# Patient Record
Sex: Female | Born: 1965 | Race: Black or African American | Hispanic: No | Marital: Married | Smoking: Never smoker
Health system: Southern US, Community
[De-identification: ages and names within clinical notes are randomized; demographics above are authoritative.]

## PROBLEM LIST (undated history)

## (undated) DIAGNOSIS — I82409 Acute embolism and thrombosis of unspecified deep veins of unspecified lower extremity: Secondary | ICD-10-CM

## (undated) HISTORY — PX: KNEE SURGERY: SHX244

---

## 2005-01-09 ENCOUNTER — Emergency Department: Payer: Self-pay | Admitting: Emergency Medicine

## 2005-02-27 ENCOUNTER — Emergency Department: Payer: Self-pay | Admitting: Emergency Medicine

## 2006-02-20 ENCOUNTER — Emergency Department: Payer: Self-pay | Admitting: Emergency Medicine

## 2006-04-08 ENCOUNTER — Emergency Department: Payer: Self-pay | Admitting: Emergency Medicine

## 2006-04-10 ENCOUNTER — Emergency Department: Payer: Self-pay | Admitting: Emergency Medicine

## 2006-10-20 ENCOUNTER — Emergency Department: Payer: Self-pay | Admitting: Emergency Medicine

## 2007-02-08 ENCOUNTER — Emergency Department: Payer: Self-pay | Admitting: Emergency Medicine

## 2007-02-08 ENCOUNTER — Other Ambulatory Visit: Payer: Self-pay

## 2007-08-23 ENCOUNTER — Ambulatory Visit: Payer: Self-pay | Admitting: Otolaryngology

## 2008-03-01 ENCOUNTER — Emergency Department: Payer: Self-pay | Admitting: Emergency Medicine

## 2009-06-29 ENCOUNTER — Emergency Department: Payer: Self-pay | Admitting: Emergency Medicine

## 2009-07-28 ENCOUNTER — Ambulatory Visit: Payer: Self-pay | Admitting: Family Medicine

## 2009-08-05 ENCOUNTER — Emergency Department: Payer: Self-pay | Admitting: Unknown Physician Specialty

## 2009-08-31 ENCOUNTER — Ambulatory Visit: Payer: Self-pay | Admitting: Obstetrics and Gynecology

## 2010-06-12 ENCOUNTER — Emergency Department: Payer: Self-pay | Admitting: Internal Medicine

## 2010-09-06 ENCOUNTER — Ambulatory Visit: Payer: Self-pay | Admitting: Family Medicine

## 2010-09-13 ENCOUNTER — Emergency Department: Payer: Self-pay

## 2010-10-29 ENCOUNTER — Emergency Department: Payer: Self-pay | Admitting: Emergency Medicine

## 2012-03-21 ENCOUNTER — Emergency Department: Payer: Self-pay | Admitting: Emergency Medicine

## 2012-06-13 ENCOUNTER — Emergency Department: Payer: Self-pay | Admitting: Emergency Medicine

## 2013-02-09 ENCOUNTER — Emergency Department: Payer: Self-pay | Admitting: Internal Medicine

## 2014-03-16 ENCOUNTER — Emergency Department: Payer: Self-pay | Admitting: Emergency Medicine

## 2014-05-17 ENCOUNTER — Emergency Department: Payer: Self-pay | Admitting: Emergency Medicine

## 2014-07-01 ENCOUNTER — Emergency Department: Payer: Self-pay | Admitting: Emergency Medicine

## 2014-07-01 LAB — CBC
HCT: 37.6 % (ref 35.0–47.0)
HGB: 12.9 g/dL (ref 12.0–16.0)
MCH: 38.9 pg — AB (ref 26.0–34.0)
MCHC: 34.2 g/dL (ref 32.0–36.0)
MCV: 114 fL — ABNORMAL HIGH (ref 80–100)
PLATELETS: 250 10*3/uL (ref 150–440)
RBC: 3.31 10*6/uL — AB (ref 3.80–5.20)
RDW: 15.6 % — ABNORMAL HIGH (ref 11.5–14.5)
WBC: 5.8 10*3/uL (ref 3.6–11.0)

## 2014-07-01 LAB — URINALYSIS, COMPLETE
BILIRUBIN, UR: NEGATIVE
Bacteria: NONE SEEN
Blood: NEGATIVE
Glucose,UR: NEGATIVE mg/dL (ref 0–75)
Ketone: NEGATIVE
Nitrite: NEGATIVE
PROTEIN: NEGATIVE
Ph: 6 (ref 4.5–8.0)
RBC,UR: 2 /HPF (ref 0–5)
Specific Gravity: 1.021 (ref 1.003–1.030)
Squamous Epithelial: 3
WBC UR: 8 /HPF (ref 0–5)

## 2014-07-01 LAB — PREGNANCY, URINE: Pregnancy Test, Urine: NEGATIVE m[IU]/mL

## 2014-07-01 LAB — COMPREHENSIVE METABOLIC PANEL
Albumin: 4 g/dL (ref 3.4–5.0)
Alkaline Phosphatase: 67 U/L
Anion Gap: 6 — ABNORMAL LOW (ref 7–16)
BUN: 19 mg/dL — ABNORMAL HIGH (ref 7–18)
Bilirubin,Total: 0.9 mg/dL (ref 0.2–1.0)
CALCIUM: 8.8 mg/dL (ref 8.5–10.1)
CHLORIDE: 106 mmol/L (ref 98–107)
CO2: 26 mmol/L (ref 21–32)
Creatinine: 0.85 mg/dL (ref 0.60–1.30)
EGFR (African American): >60
EGFR (Non-African Amer.): >60
GLUCOSE: 93 mg/dL (ref 65–99)
OSMOLALITY: 278 (ref 275–301)
POTASSIUM: 3.8 mmol/L (ref 3.5–5.1)
SGOT(AST): 15 U/L (ref 15–37)
SGPT (ALT): 22 U/L
Sodium: 138 mmol/L (ref 136–145)
Total Protein: 7.8 g/dL (ref 6.4–8.2)

## 2014-07-01 LAB — CK TOTAL AND CKMB (NOT AT ARMC)
CK, TOTAL: 251 U/L — AB
CK-MB: 2 ng/mL (ref 0.5–3.6)

## 2014-07-01 LAB — TROPONIN I

## 2014-11-21 ENCOUNTER — Emergency Department: Payer: Self-pay | Admitting: Physician Assistant

## 2015-01-07 ENCOUNTER — Emergency Department: Payer: Self-pay | Admitting: Internal Medicine

## 2015-02-13 ENCOUNTER — Emergency Department: Admit: 2015-02-13 | Disposition: A | Discharge status: Home / Self Care | Payer: Self-pay | Admitting: Student

## 2015-02-13 LAB — BASIC METABOLIC PANEL
ANION GAP: 6 — AB (ref 7–16)
BUN: 16 mg/dL
CHLORIDE: 110 mmol/L
Calcium, Total: 8.5 mg/dL — ABNORMAL LOW
Co2: 22 mmol/L
Creatinine: 0.58 mg/dL
EGFR (African American): >60
Glucose: 99 mg/dL
POTASSIUM: 3.5 mmol/L
Sodium: 138 mmol/L

## 2015-02-13 LAB — CBC
HCT: 32.8 % — ABNORMAL LOW (ref 35.0–47.0)
HGB: 11 g/dL — ABNORMAL LOW (ref 12.0–16.0)
MCH: 40.1 pg — ABNORMAL HIGH (ref 26.0–34.0)
MCHC: 33.4 g/dL (ref 32.0–36.0)
MCV: 120 fL — ABNORMAL HIGH (ref 80–100)
Platelet: 220 10*3/uL (ref 150–440)
RBC: 2.73 10*6/uL — ABNORMAL LOW (ref 3.80–5.20)
RDW: 17.3 % — ABNORMAL HIGH (ref 11.5–14.5)
WBC: 4.9 10*3/uL (ref 3.6–11.0)

## 2015-03-04 IMAGING — CR DG CHEST 2V
1 series · 2 of 2 positions shown · non-contrast
Comparison: 03/21/2012.

CLINICAL DATA: Chest pain

EXAM:
CHEST  2 VIEW

[Series 1: dxr chest pa (or ap) and lateral · 0.14mm/px · 2 of 2 slices shown]
[im 1/2]
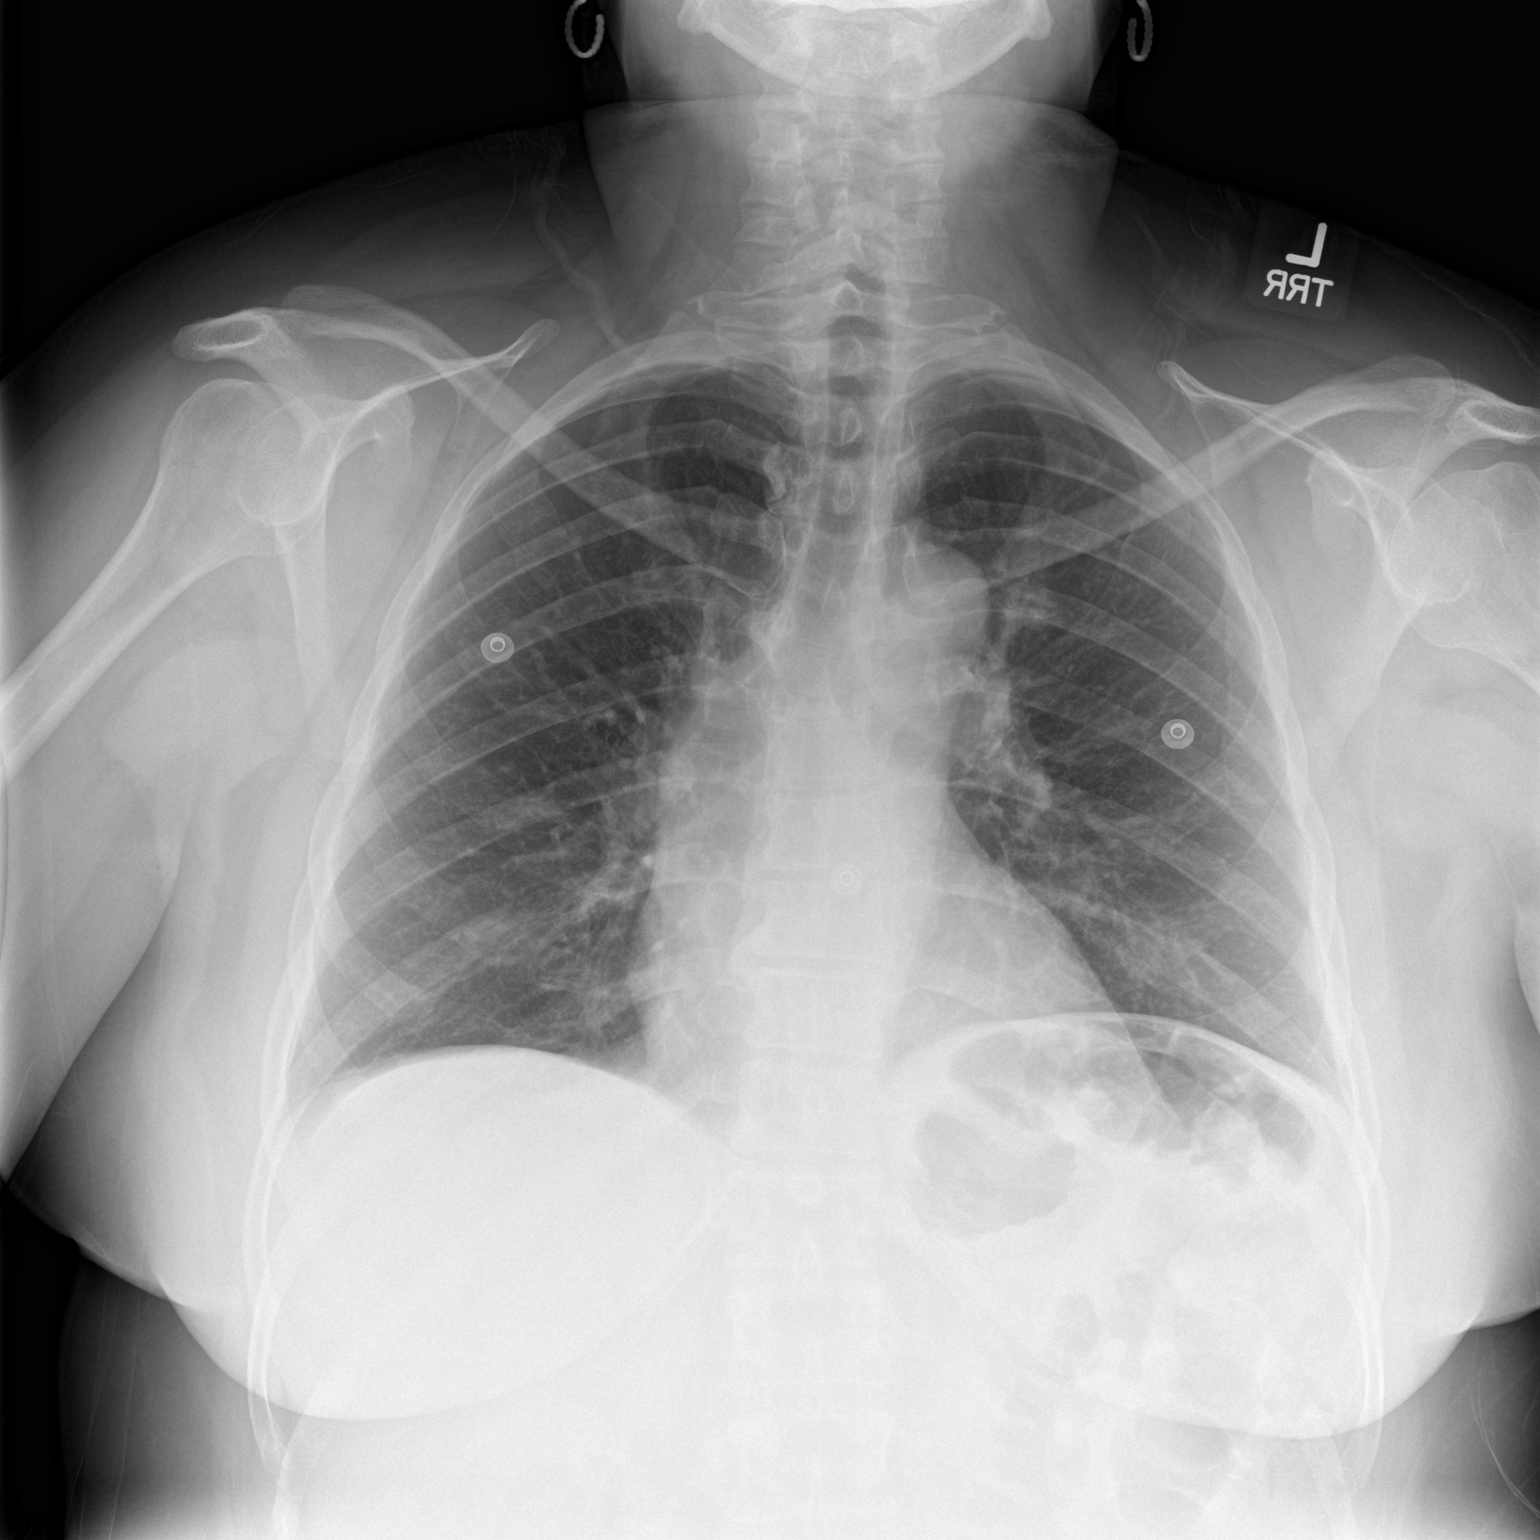
[im 2/2]
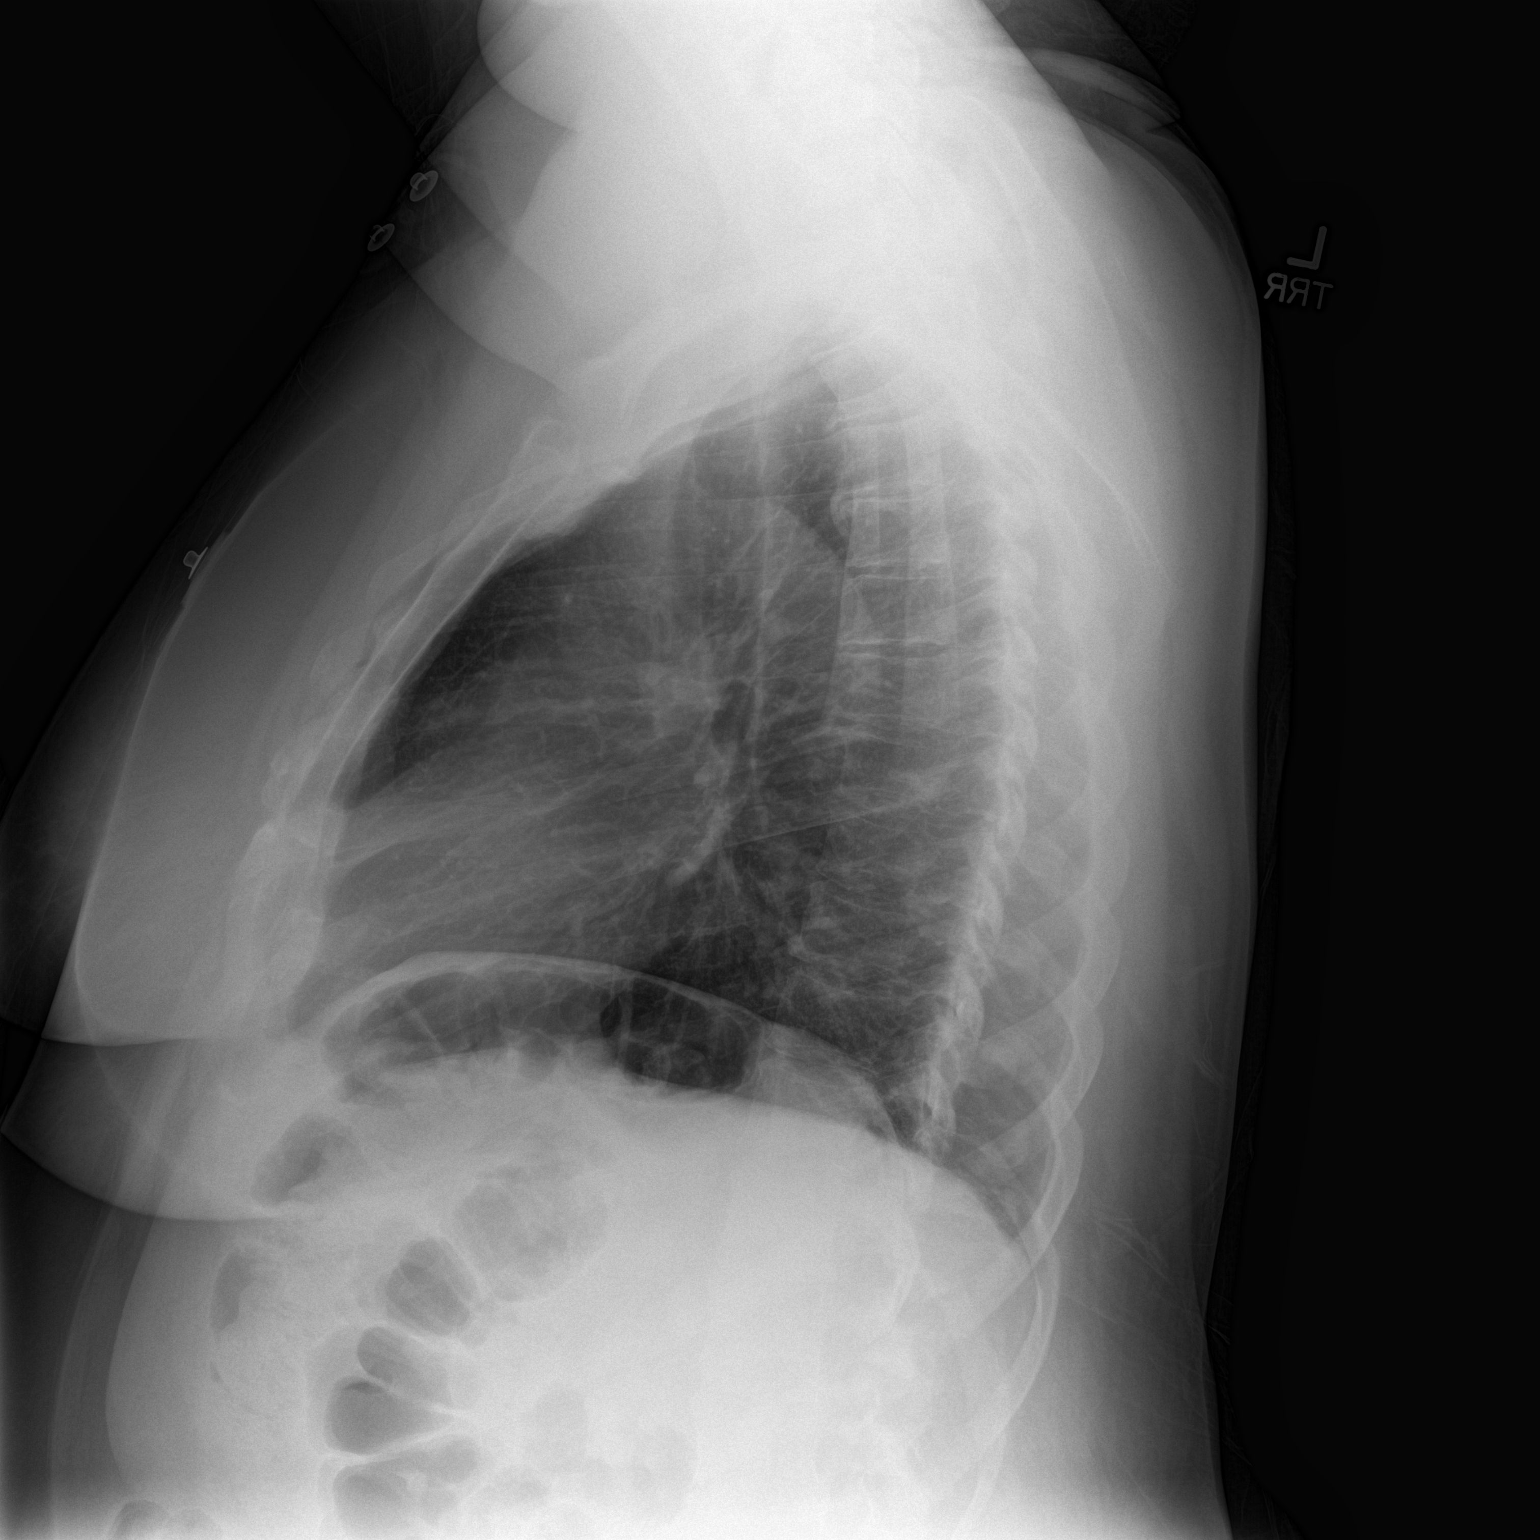

[2 of 2 positions shown; findings below may reference images not displayed]

FINDINGS: The lungs are clear without focal infiltrate, edema, pneumothorax or
pleural effusion. The cardiopericardial silhouette is within normal
limits for size. Imaged bony structures of the thorax are intact.
IMPRESSION: No acute cardiopulmonary findings.

## 2015-04-07 ENCOUNTER — Emergency Department
Admission: EM | Admit: 2015-04-07 | Discharge: 2015-04-07 | Disposition: A | Discharge status: Home / Self Care | Payer: BLUE CROSS/BLUE SHIELD | Attending: Emergency Medicine | Admitting: Emergency Medicine

## 2015-04-07 ENCOUNTER — Encounter: Payer: Self-pay | Admitting: General Practice

## 2015-04-07 ENCOUNTER — Emergency Department: Payer: BLUE CROSS/BLUE SHIELD

## 2015-04-07 DIAGNOSIS — Z7901 Long term (current) use of anticoagulants: Secondary | ICD-10-CM | POA: Diagnosis not present

## 2015-04-07 DIAGNOSIS — R609 Edema, unspecified: Secondary | ICD-10-CM

## 2015-04-07 DIAGNOSIS — R2242 Localized swelling, mass and lump, left lower limb: Secondary | ICD-10-CM | POA: Diagnosis present

## 2015-04-07 DIAGNOSIS — I824Z2 Acute embolism and thrombosis of unspecified deep veins of left distal lower extremity: Secondary | ICD-10-CM | POA: Insufficient documentation

## 2015-04-07 DIAGNOSIS — I82402 Acute embolism and thrombosis of unspecified deep veins of left lower extremity: Secondary | ICD-10-CM

## 2015-04-07 DIAGNOSIS — R52 Pain, unspecified: Secondary | ICD-10-CM

## 2015-04-07 LAB — CBC
HEMATOCRIT: 29.4 % — AB (ref 35.0–47.0)
Hemoglobin: 10 g/dL — ABNORMAL LOW (ref 12.0–16.0)
MCH: 42.8 pg — ABNORMAL HIGH (ref 26.0–34.0)
MCHC: 34.1 g/dL (ref 32.0–36.0)
MCV: 125.4 fL — ABNORMAL HIGH (ref 80.0–100.0)
Platelets: 214 10*3/uL (ref 150–440)
RBC: 2.35 MIL/uL — ABNORMAL LOW (ref 3.80–5.20)
RDW: 15.4 % — AB (ref 11.5–14.5)
WBC: 4.8 10*3/uL (ref 3.6–11.0)

## 2015-04-07 LAB — COMPREHENSIVE METABOLIC PANEL
ALT: 16 U/L (ref 14–54)
ANION GAP: 5 (ref 5–15)
AST: 18 U/L (ref 15–41)
Albumin: 4 g/dL (ref 3.5–5.0)
Alkaline Phosphatase: 47 U/L (ref 38–126)
BUN: 14 mg/dL (ref 6–20)
CHLORIDE: 110 mmol/L (ref 101–111)
CO2: 25 mmol/L (ref 22–32)
Calcium: 8.4 mg/dL — ABNORMAL LOW (ref 8.9–10.3)
Creatinine, Ser: 0.71 mg/dL (ref 0.44–1.00)
GFR calc Af Amer: >60 mL/min (ref >60)
GFR calc non Af Amer: >60 mL/min (ref >60)
Glucose, Bld: 92 mg/dL (ref 65–99)
Potassium: 4 mmol/L (ref 3.5–5.1)
Sodium: 140 mmol/L (ref 135–145)
Total Bilirubin: 0.9 mg/dL (ref 0.3–1.2)
Total Protein: 7.2 g/dL (ref 6.5–8.1)

## 2015-04-07 MED ORDER — RIVAROXABAN (XARELTO) VTE STARTER PACK (15 & 20 MG): ORAL_TABLET | ORAL | Status: AC

## 2015-04-07 MED ORDER — RIVAROXABAN 15 MG PO TABS
15.0000 mg | ORAL_TABLET | Freq: Once | ORAL | Status: AC
  Administered 2015-04-07: 15 mg via ORAL
  Filled 2015-04-07: qty 1

## 2015-04-07 NOTE — ED Notes (Signed)
Pt states she has had pain in her LLE x 2 weeks and it is getting worse. Ankle appears swollen and red, and it hot and tender to the touch. Pt alert & oriented with nad.

## 2015-04-07 NOTE — ED Provider Notes (Signed)
[facility] Emergency Department Provider Note  ____________________________________________  Time seen: 37  I have reviewed the triage vital signs and the nursing notes.   HISTORY  Chief Complaint Leg Swelling and Leg Pain     HPI Alejandra Andrews is a 49 y.o. female who reports she has been having discomfort and swelling in her left leg for approximately 2 weeks. She denies any accident. She has not had any travel or immobilization. She has no history of blood clots. She was seen approximate one week ago at [facility] in [place].  She had a negative x-ray at that time.  She presented to the emergency department now due to ongoing swelling and discomfort. She is having some erythema in the lower leg.  She denies any shortness of breath or chest pain.    History reviewed. No pertinent past medical history.  There are no active problems to display for this patient.   Past Surgical History  Procedure Laterality Date  . Knee surgery      Current Outpatient Rx  Name  Route  Sig  Dispense  Refill  . Rivaroxaban (XARELTO STARTER PACK) 15 & 20 MG TBPK      Take as directed on package: Start with one 15mg  tablet by mouth twice a day with food. On Day 22, switch to one 20mg  tablet once a day with food.   51 each   0     Allergies Review of patient's allergies indicates no known allergies.  No family history on file.  Social History History  Substance Use Topics  . Smoking status: Never Smoker   . Smokeless tobacco: Not on file  . Alcohol Use: Yes     Comment: causal    Review of Systems  Constitutional: Negative for fever. ENT: Negative for sore throat. Cardiovascular: Negative for chest pain. Respiratory: Negative for shortness of breath. Gastrointestinal: Negative for abdominal pain, vomiting and diarrhea. Genitourinary: Negative for dysuria. Musculoskeletal: Notable for swelling of the left lower leg. Skin: Erythema of the  lower leg on the left.. Neurological: Negative for headaches   10-point ROS otherwise negative.  ____________________________________________   PHYSICAL EXAM:  VITAL SIGNS: ED Triage Vitals  Enc Vitals Group     BP 04/07/15 1728 141/81 mmHg     Pulse Rate 04/07/15 1728 73     Resp 04/07/15 1728 18     Temp 04/07/15 1728 98.8 F (37.1 C)     Temp Source 04/07/15 1728 Oral     SpO2 04/07/15 1728 99 %     Weight 04/07/15 1728 216 lb (97.977 kg)     Height 04/07/15 1728 5\' 5"  (1.651 m)     Head Cir --      Peak Flow --      Pain Score 04/07/15 1728 9     Pain Loc --      Pain Edu? --      Excl. in GC? --     Constitutional:  Alert and oriented. Well appearing and in no distress. ENT   Head: Normocephalic and atraumatic.   Nose: No congestion/rhinnorhea.   Mouth/Throat: Mucous membranes are moist. Cardiovascular: Normal rate, regular rhythm. Respiratory: Normal respiratory effort without tachypnea. Breath sounds are clear and equal bilaterally. No wheezes/rales/rhonchi. Gastrointestinal: Soft and nontender. No distention.  Back: No muscle spasm, no tenderness, no CVA tenderness. Musculoskeletal: Nontender with normal range of motion in all extremities.  Notable edema in the left lower leg. Neurologic:  Normal speech and  language. No gross focal neurologic deficits are appreciated.  Skin:  Skin is warm, dry. The left lower leg is mildly erythematous.  Psychiatric: Mood and affect are normal. Speech and behavior are normal.  ____________________________________________    LABS (pertinent positives/negatives)  CBC: White blood cell 4.8, hemoglobin of 10, platelets of 214 Metabolic panel within normal limits.  ____________________________________________   RADIOLOGY  Doppler ultrasound of the left lower leg IMPRESSION: Occlusive thrombus noted filling the left popliteal vein. The calf veins are not visualized on this  study.  ____________________________________________ ____________________________________________   INITIAL IMPRESSION / ASSESSMENT AND PLAN / ED COURSE  Patient with signs and symptoms of a DVT now confirmed by ultrasound. The patient does not have a history of prior DVT. She has no health problems and takes no medications. We will start her on an anticoagulant. She has the ability to obtain this anticoagulant. We'll give her the first dose tonight in the emergency department. Her primary physician is Dr. Harl Bowie. A vascular follow-up with him as well as with vascular surgery.  ____________________________________________   FINAL CLINICAL IMPRESSION(S) / ED DIAGNOSES  Final diagnoses:  DVT of leg (deep venous thrombosis), left      Darien Ramus, MD 04/07/15 2100

## 2015-04-07 NOTE — Discharge Instructions (Signed)
Take Xarelto as prescribed. Follow-up with vascular surgery, Dr. Wyn Quaker or Dr. Lorretta Harp. Follow up with your regular doctor.  Return to the Em Dept if you have any chest pain or shortness of breath or if you have any other urgent concerns.

## 2015-04-07 NOTE — ED Notes (Signed)
Pt. Arrived to ed from home with reports of painful and swelling to LLE. Pt reports swelling and pain started about two weeks ago. Pt reports being seen last week by anther ED and was told "nothing was wrong with it" per pt. Pt reports no improvement with pain or swelling. Pt ambulatory Alert and oriented.

## 2015-04-16 ENCOUNTER — Ambulatory Visit: Admit: 2015-04-16 | Payer: Self-pay | Admitting: Vascular Surgery

## 2015-04-16 ENCOUNTER — Ambulatory Visit
Admission: RE | Admit: 2015-04-16 | Discharge: 2015-04-16 | Disposition: A | Discharge status: Home / Self Care | Payer: BLUE CROSS/BLUE SHIELD | Source: Ambulatory Visit | Attending: Vascular Surgery | Admitting: Vascular Surgery

## 2015-04-16 ENCOUNTER — Encounter
Admission: RE | Disposition: A | Discharge status: Home / Self Care | Payer: Self-pay | Source: Ambulatory Visit | Attending: Vascular Surgery

## 2015-04-16 SURGERY — IVC FILTER INSERTION
Anesthesia: Moderate Sedation | Laterality: Left

## 2015-04-20 ENCOUNTER — Encounter
Admission: RE | Disposition: A | Discharge status: Home / Self Care | Payer: Self-pay | Source: Ambulatory Visit | Attending: Vascular Surgery

## 2015-04-20 ENCOUNTER — Encounter: Payer: Self-pay | Admitting: *Deleted

## 2015-04-20 ENCOUNTER — Ambulatory Visit: Admit: 2015-04-20 | Payer: Self-pay | Admitting: Vascular Surgery

## 2015-04-20 ENCOUNTER — Ambulatory Visit
Admission: RE | Admit: 2015-04-20 | Discharge: 2015-04-20 | Disposition: A | Discharge status: Home / Self Care | Payer: BLUE CROSS/BLUE SHIELD | Source: Ambulatory Visit | Attending: Vascular Surgery | Admitting: Vascular Surgery

## 2015-04-20 DIAGNOSIS — I82432 Acute embolism and thrombosis of left popliteal vein: Secondary | ICD-10-CM | POA: Diagnosis present

## 2015-04-20 DIAGNOSIS — Z79899 Other long term (current) drug therapy: Secondary | ICD-10-CM | POA: Insufficient documentation

## 2015-04-20 DIAGNOSIS — Z6839 Body mass index (BMI) 39.0-39.9, adult: Secondary | ICD-10-CM | POA: Insufficient documentation

## 2015-04-20 HISTORY — PX: PERIPHERAL VASCULAR CATHETERIZATION: SHX172C

## 2015-04-20 HISTORY — DX: Acute embolism and thrombosis of unspecified deep veins of unspecified lower extremity: I82.409

## 2015-04-20 LAB — BASIC METABOLIC PANEL
Anion gap: 6 (ref 5–15)
BUN: 14 mg/dL (ref 6–20)
CHLORIDE: 108 mmol/L (ref 101–111)
CO2: 26 mmol/L (ref 22–32)
CREATININE: 0.57 mg/dL (ref 0.44–1.00)
Calcium: 8.5 mg/dL — ABNORMAL LOW (ref 8.9–10.3)
GFR calc Af Amer: >60 mL/min (ref >60)
GFR calc non Af Amer: >60 mL/min (ref >60)
Glucose, Bld: 87 mg/dL (ref 65–99)
Potassium: 4 mmol/L (ref 3.5–5.1)
Sodium: 140 mmol/L (ref 135–145)

## 2015-04-20 SURGERY — THROMBECTOMY
Anesthesia: Moderate Sedation

## 2015-04-20 SURGERY — THROMBECTOMY
Anesthesia: Moderate Sedation | Laterality: Left

## 2015-04-20 SURGERY — IVC FILTER INSERTION
Anesthesia: Moderate Sedation

## 2015-04-20 MED ORDER — FENTANYL CITRATE (PF) 100 MCG/2ML IJ SOLN
INTRAMUSCULAR | Status: AC
Start: 2015-04-20 — End: 2015-04-20
  Filled 2015-04-20: qty 2

## 2015-04-20 MED ORDER — RIVAROXABAN 20 MG PO TABS
20.0000 mg | ORAL_TABLET | Freq: Every day | ORAL | Status: DC
  Filled 2015-04-20 (×3): qty 1

## 2015-04-20 MED ORDER — LIDOCAINE HCL (PF) 1 % IJ SOLN
INTRAMUSCULAR | Status: AC
  Filled 2015-04-20: qty 10

## 2015-04-20 MED ORDER — HEPARIN SODIUM (PORCINE) 1000 UNIT/ML IJ SOLN
INTRAMUSCULAR | Status: AC
  Filled 2015-04-20: qty 1

## 2015-04-20 MED ORDER — SODIUM CHLORIDE 0.9 % IV SOLN
INTRAVENOUS | Status: DC
  Administered 2015-04-20: 11:00:00 via INTRAVENOUS

## 2015-04-20 MED ORDER — FENTANYL CITRATE (PF) 100 MCG/2ML IJ SOLN
INTRAMUSCULAR | Status: AC
  Filled 2015-04-20: qty 2

## 2015-04-20 MED ORDER — CEFAZOLIN SODIUM 1-5 GM-% IV SOLN
INTRAVENOUS | Status: AC
  Filled 2015-04-20: qty 50

## 2015-04-20 MED ORDER — IOHEXOL 300 MG/ML  SOLN
INTRAMUSCULAR | Status: DC | PRN
  Administered 2015-04-20: 35 mL via INTRAVENOUS

## 2015-04-20 MED ORDER — MIDAZOLAM HCL 2 MG/2ML IJ SOLN
INTRAMUSCULAR | Status: DC | PRN
  Administered 2015-04-20: 1 mg via INTRAVENOUS
  Administered 2015-04-20: 2 mg via INTRAVENOUS

## 2015-04-20 MED ORDER — FENTANYL CITRATE (PF) 100 MCG/2ML IJ SOLN
INTRAMUSCULAR | Status: DC | PRN
  Administered 2015-04-20: 50 ug via INTRAVENOUS

## 2015-04-20 MED ORDER — STERILE WATER FOR INJECTION IJ SOLN
INTRAMUSCULAR | Status: AC
  Filled 2015-04-20: qty 40

## 2015-04-20 MED ORDER — CEFAZOLIN SODIUM 1-5 GM-% IV SOLN
1.0000 g | Freq: Once | INTRAVENOUS | Status: AC
  Administered 2015-04-20: 1 g via INTRAVENOUS

## 2015-04-20 MED ORDER — LIDOCAINE HCL (PF) 1 % IJ SOLN
INTRAMUSCULAR | Status: AC
Start: 2015-04-20 — End: 2015-04-20
  Filled 2015-04-20: qty 5

## 2015-04-20 MED ORDER — MIDAZOLAM HCL 5 MG/5ML IJ SOLN
INTRAMUSCULAR | Status: AC
  Filled 2015-04-20: qty 5

## 2015-04-20 SURGICAL SUPPLY — 14 items
CATH SLIP KMP 65CM 5FR (CATHETERS) ×4 IMPLANT
DEVICE PRESTO INFLATION (MISCELLANEOUS) ×4 IMPLANT
DEVICE SAFEGUARD 24CM (GAUZE/BANDAGES/DRESSINGS) ×4 IMPLANT
DRAPE BRACHIAL (DRAPES) ×4 IMPLANT
DRAPE TABLE BACK 80X90 (DRAPES) ×4 IMPLANT
FILTER VC CELECT-FEMORAL (Filter) ×4 IMPLANT
PACK ANGIOGRAPHY (CUSTOM PROCEDURE TRAY) ×4 IMPLANT
PREP CHG 10.5 TEAL (MISCELLANEOUS) ×4 IMPLANT
SET INTRO CAPELLA COAXIAL (SET/KITS/TRAYS/PACK) ×4 IMPLANT
SET ZELANTE DVT THROMB (CATHETERS) ×4 IMPLANT
SHEATH BRITE TIP 8FRX11 (SHEATH) ×4 IMPLANT
TOWEL OR 17X26 4PK STRL BLUE (TOWEL DISPOSABLE) ×4 IMPLANT
WIRE J 3MM .035X145CM (WIRE) ×4 IMPLANT
WIRE MAGIC TORQUE 260C (WIRE) ×4 IMPLANT

## 2015-04-20 NOTE — Discharge Instructions (Signed)
Embolectomy and Thrombectomy, Care After Refer to this sheet in the next few weeks. These discharge instructions provide you with general information on caring for yourself after you leave the hospital. Your health care provider may also give you specific instructions. Your treatment has been planned according to the most current medical practices available, but unavoidable complications sometimes occur. If you have any problems or questions after discharge, call your health care provider. HOME CARE INSTRUCTIONS  You will probably be put on blood thinners. Take them as directed.  Tell your health care provider about any unusual bruising or bleeding, including nosebleeds, blood in your urine, blood in your stools, or if you are vomiting blood. Blood in the stools may be black and tarry or red.  Keep your follow-up appointments to check how thin your blood is.  Change your bandages (dressings) as instructed.  Do not swim, bathe, or shower except as allowed by your health care provider.  Do not smoke.  Do not drink alcohol.  Do not use any tobacco products including cigarettes, chewing tobacco, or electronic cigarettes. SEEK IMMEDIATE MEDICAL CARE IF:  You have bleeding from the surgical site.  You have a sudden pain in the foot, leg, hand, or arm.  You have sudden abdominal pain.  You have a sudden facial droop, weakness on one side of your body, or trouble speaking.  You have bloody stools.  You vomit blood.  You suddenly feel short of breath, weak, or dizzy.  You have chest pain.  You have drainage, redness, swelling, or pain at the surgery site.  You have a fever. MAKE SURE YOU:  Understand these instructions.  Will watch your condition.  Will get help right away if you are not doing well or get worse. Document Released: 02/03/2011 Document Revised: 02/23/2014 Document Reviewed: 02/03/2011 [facility] Patient Information 2015 [place], [place]. This information is not  intended to replace advice given to you by your health care provider. Make sure you discuss any questions you have with your health care provider. Inferior Vena Cava Filter Insertion, Care After Refer to this sheet in the next few weeks. These instructions provide you with information on caring for yourself after your procedure. Your health care provider may also give you more specific instructions. Your treatment has been planned according to current medical practices, but problems sometimes occur. Call your health care provider if you have any problems or questions after your procedure. WHAT TO EXPECT AFTER THE PROCEDURE After your procedure, it is typical to have the following:  Mild pain in the area where the filter was inserted.  Mild bruising in the area where the filter was inserted. HOME CARE INSTRUCTIONS  You will be given medicine to control pain. Only take over-the-counter or prescription medicines for pain, fever, or discomfort as directed by your health care provider.  A bandage (dressing) has been placed over the insertion site. Follow your health care provider's instructions on how to care for it.  Keep the insertion site clean and dry.  Do not soak in a bath tub or pool until the filter insertion site has healed.  Do not drive if you are taking narcotic pain medicines. Follow your health care provider's instructions about driving.  Do not return to work or school until your health care provider says it is okay.   Keep all follow-up appointments.  SEEK IMMEDIATE MEDICAL CARE IF:  You develop swelling and discoloration or pain in the legs.  Your legs become pale and cold or blue.  You develop shortness of breath, feel faint, or pass out.  You develop chest pain, a cough, or difficulty breathing.  You cough up blood.  You develop a rash or feel you are having problems that may be a side effect of medicines.  You develop weakness, difficulty moving your arms or  legs, or balance problems.  You develop problems with speech or vision. Document Released: 07/30/2013 Document Reviewed: 07/30/2013 [facility] Patient Information 2015 [place], [place]. This information is not intended to replace advice given to you by your health care provider. Make sure you discuss any questions you have with your health care provider.

## 2015-04-21 ENCOUNTER — Encounter: Payer: Self-pay | Admitting: Vascular Surgery

## 2015-05-15 ENCOUNTER — Emergency Department
Admission: EM | Admit: 2015-05-15 | Discharge: 2015-05-15 | Disposition: A | Discharge status: Home / Self Care | Payer: BLUE CROSS/BLUE SHIELD | Attending: Emergency Medicine | Admitting: Emergency Medicine

## 2015-05-15 DIAGNOSIS — I82402 Acute embolism and thrombosis of unspecified deep veins of left lower extremity: Secondary | ICD-10-CM | POA: Insufficient documentation

## 2015-05-15 DIAGNOSIS — Z7901 Long term (current) use of anticoagulants: Secondary | ICD-10-CM | POA: Insufficient documentation

## 2015-05-15 NOTE — ED Notes (Signed)
Pt reports having vena cava filter placed to left leg on 04/20/15 by Dr. Gilda Crease. Reports pain, swelling and numbness at times to left leg. Also c/o numbness to bilateral hands. Reports unable to wait for MD appt to be evaluated.

## 2015-05-15 NOTE — Discharge Instructions (Signed)
Deep Vein Thrombosis  Please continue Xarelto. Keep your left leg elevated and minimize time on your feet to improve swelling. Return to the emergency room if you develop trouble breathing, feel short of breath, have a cough or chest pain, numbness or tingling in the foot, a cold or blue foot, or new concerns arise.  Follow-up with vascular surgery on Monday with Dr. Gilda Crease.  A deep vein thrombosis (DVT) is a blood clot that develops in the deep, larger veins of the leg, arm, or pelvis. These are more dangerous than clots that might form in veins near the surface of the body. A DVT can lead to serious and even life-threatening complications if the clot breaks off and travels in the bloodstream to the lungs.  A DVT can damage the valves in your leg veins so that instead of flowing upward, the blood pools in the lower leg. This is called post-thrombotic syndrome, and it can result in pain, swelling, discoloration, and sores on the leg. CAUSES Usually, several things contribute to the formation of blood clots. Contributing factors include:  The flow of blood slows down.  The inside of the vein is damaged in some way.  You have a condition that makes blood clot more easily. RISK FACTORS Some people are more likely than others to develop blood clots. Risk factors include:   Smoking.  Being overweight (obese).  Sitting or lying still for a long time. This includes long-distance travel, paralysis, or recovery from an illness or surgery. Other factors that increase risk are:   Older age, especially over 68 years of age.  Having a family history of blood clots or if you have already had a blot clot.  Having major or lengthy surgery. This is especially true for surgery on the hip, knee, or belly (abdomen). Hip surgery is particularly high risk.  Having a long, thin tube (catheter) placed inside a vein during a medical procedure.  Breaking a hip or leg.  Having cancer or cancer  treatment.  Pregnancy and childbirth.  Hormone changes make the blood clot more easily during pregnancy.  The fetus puts pressure on the veins of the pelvis.  There is a risk of injury to veins during delivery or a caesarean delivery. The risk is highest just after childbirth.  Medicines containing the female hormone estrogen. This includes birth control pills and hormone replacement therapy.  Other circulation or heart problems.  SIGNS AND SYMPTOMS When a clot forms, it can either partially or totally block the blood flow in that vein. Symptoms of a DVT can include:  Swelling of the leg or arm, especially if one side is much worse.  Warmth and redness of the leg or arm, especially if one side is much worse.  Pain in an arm or leg. If the clot is in the leg, symptoms may be more noticeable or worse when standing or walking. The symptoms of a DVT that has traveled to the lungs (pulmonary embolism, PE) usually start suddenly and include:  Shortness of breath.  Coughing.  Coughing up blood or blood-tinged mucus.  Chest pain. The chest pain is often worse with deep breaths.  Rapid heartbeat. Anyone with these symptoms should get emergency medical treatment right away. Do not wait to see if the symptoms will go away. Call your local emergency services (911 in the [place] if you have these symptoms. Do not drive yourself to the hospital. DIAGNOSIS If a DVT is suspected, your health care provider will take a full  medical history and perform a physical exam. Tests that also may be required include:  Blood tests, including studies of the clotting properties of the blood.  Ultrasound to see if you have clots in your legs or lungs.  X-rays to show the flow of blood when dye is injected into the veins (venogram).  Studies of your lungs if you have any chest symptoms. PREVENTION  Exercise the legs regularly. Take a brisk 30-minute walk every day.  Maintain a weight that is  appropriate for your height.  Avoid sitting or lying in bed for long periods of time without moving your legs.  Women, particularly those over the age of 35 years, should consider the risks and benefits of taking estrogen medicines, including birth control pills.  Do not smoke, especially if you take estrogen medicines.  Long-distance travel can increase your risk of DVT. You should exercise your legs by walking or pumping the muscles every hour.  Many of the risk factors above relate to situations that exist with hospitalization, either for illness, injury, or elective surgery. Prevention may include medical and nonmedical measures.  Your health care provider will assess you for the need for venous thromboembolism prevention when you are admitted to the hospital. If you are having surgery, your surgeon will assess you the day of or day after surgery. TREATMENT Once identified, a DVT can be treated. It can also be prevented in some circumstances. Once you have had a DVT, you may be at increased risk for a DVT in the future. The most common treatment for DVT is blood-thinning (anticoagulant) medicine, which reduces the blood's tendency to clot. Anticoagulants can stop new blood clots from forming and stop old clots from growing. They cannot dissolve existing clots. Your body does this by itself over time. Anticoagulants can be given by mouth, through an IV tube, or by injection. Your health care provider will determine the best program for you. Other medicines or treatments that may be used are:  Heparin or related medicines (low molecular weight heparin) are often the first treatment for a blood clot. They act quickly. However, they cannot be taken orally and must be given either in shot form or by IV tube.  Heparin can cause a fall in a component of blood that stops bleeding and forms blood clots (platelets). You will be monitored with blood tests to be sure this does not occur.  Warfarin is an  anticoagulant that can be swallowed. It takes a few days to start working, so usually heparin or related medicines are used in combination. Once warfarin is working, heparin is usually stopped.  Factor Xa inhibitor medicines, such as rivaroxaban and apixaban, also reduce blood clotting. These medicines are taken orally and can often be used without heparin or related medicines.  Less commonly, clot dissolving drugs (thrombolytics) are used to dissolve a DVT. They carry a high risk of bleeding, so they are used mainly in severe cases where your life or a part of your body is threatened.  Very rarely, a blood clot in the leg needs to be removed surgically.  If you are unable to take anticoagulants, your health care provider may arrange for you to have a filter placed in a main vein in your abdomen. This filter prevents clots from traveling to your lungs. HOME CARE INSTRUCTIONS  Take all medicines as directed by your health care provider.  Learn as much as you can about DVT.  Wear a medical alert bracelet or carry a medical  alert card.  Ask your health care provider how soon you can go back to normal activities. It is important to stay active to prevent blood clots. If you are on anticoagulant medicine, avoid contact sports.  It is very important to exercise. This is especially important while traveling, sitting, or standing for long periods of time. Exercise your legs by walking or by tightening and relaxing your leg muscles regularly. Take frequent walks.  You may need to wear compression stockings. These are tight elastic stockings that apply pressure to the lower legs. This pressure can help keep the blood in the legs from clotting. Taking Warfarin Warfarin is a daily medicine that is taken by mouth. Your health care provider will advise you on the length of treatment (usually 3-6 months, sometimes lifelong). If you take warfarin:  Understand how to take warfarin and foods that can affect  how warfarin works in Public relations account executive.  Too much and too little warfarin are both dangerous. Too much warfarin increases the risk of bleeding. Too little warfarin continues to allow the risk for blood clots. Warfarin and Regular Blood Testing While taking warfarin, you will need to have regular blood tests to measure your blood clotting time. These blood tests usually include both the prothrombin time (PT) and international normalized ratio (INR) tests. The PT and INR results allow your health care provider to adjust your dose of warfarin. It is very important that you have your PT and INR tested as often as directed by your health care provider.  Warfarin and Your Diet Avoid major changes in your diet, or notify your health care provider before changing your diet. Arrange a visit with a registered dietitian to answer your questions. Many foods, especially foods high in vitamin K, can interfere with warfarin and affect the PT and INR results. You should eat a consistent amount of foods high in vitamin K. Foods high in vitamin K include:   Spinach, kale, broccoli, cabbage, collard and turnip greens, Brussels sprouts, peas, cauliflower, seaweed, and parsley.  Beef and pork liver.  Green tea.  Soybean oil. Warfarin with Other Medicines Many medicines can interfere with warfarin and affect the PT and INR results. You must:  Tell your health care provider about any and all medicines, vitamins, and supplements you take, including aspirin and other over-the-counter anti-inflammatory medicines. Be especially cautious with aspirin and anti-inflammatory medicines. Ask your health care provider before taking these.  Do not take or discontinue any prescribed or over-the-counter medicine except on the advice of your health care provider or pharmacist. Warfarin Side Effects Warfarin can have side effects, such as easy bruising and difficulty stopping bleeding. Ask your health care provider or pharmacist about  other side effects of warfarin. You will need to:  Hold pressure over cuts for longer than usual.  Notify your dentist and other health care providers that you are taking warfarin before you undergo any procedures where bleeding may occur. Warfarin with Alcohol and Tobacco   Drinking alcohol frequently can increase the effect of warfarin, leading to excess bleeding. It is best to avoid alcoholic drinks or to consume only very small amounts while taking warfarin. Notify your health care provider if you change your alcohol intake.   Do not use any tobacco products including cigarettes, chewing tobacco, or electronic cigarettes. If you smoke, quit. Ask your health care provider for help with quitting smoking. Alternative Medicines to Warfarin: Factor Xa Inhibitor Medicines  These blood-thinning medicines are taken by mouth, usually for several  weeks or longer. It is important to take the medicine every single day at the same time each day.  There are no regular blood tests required when using these medicines.  There are fewer food and drug interactions than with warfarin.  The side effects of this class of medicine are similar to those of warfarin, including excessive bruising or bleeding. Ask your health care provider or pharmacist about other potential side effects. SEEK MEDICAL CARE IF:  You notice a rapid heartbeat.  You feel weaker or more tired than usual.  You feel faint.  You notice increased bruising.  You feel your symptoms are not getting better in the time expected.  You believe you are having side effects of medicine. SEEK IMMEDIATE MEDICAL CARE IF:  You have chest pain.  You have trouble breathing.  You have new or increased swelling or pain in one leg.  You cough up blood.  You notice blood in vomit, in a bowel movement, or in urine. MAKE SURE YOU:  Understand these instructions.  Will watch your condition.  Will get help right away if you are not doing  well or get worse. Document Released: 10/09/2005 Document Revised: 02/23/2014 Document Reviewed: 06/16/2013 [facility] Patient Information 2015 [place], [place]. This information is not intended to replace advice given to you by your health care provider. Make sure you discuss any questions you have with your health care provider.

## 2015-05-15 NOTE — ED Provider Notes (Signed)
[facility] Emergency Department Provider Note  ____________________________________________  Time seen: Approximately 10:13 PM  I have reviewed the triage vital signs and the nursing notes.   HISTORY  Chief Complaint Leg Pain    HPI Alejandra Andrews is a 49 y.o. female history of an IVC filter placed at the end of June in the left lower leg for a DVT. She reports that since the time of having her IVC filter placed she has had slow and increasing swelling in the left leg. She comes in for evaluation today as she called vascular surgery's office who told him that should she have any concerns for complications to come to the ER. She denies any numbness or tingling in the leg except occasionally she'll feel that the whole leg feels just slightly kind of swollen numb. She is not lost any sensation. Normal muscle strength. The foot is not painful. She has noticed a slow but steady increase in the amount of swelling.  No trouble breathing. No cough. Does not get Short of breath with laying flat. No history of heart failure. No chest pain or trouble breathing.  She is currently taking Xarelto as prescribed  Past Medical History  Diagnosis Date  . DVT (deep venous thrombosis)     There are no active problems to display for this patient.   Past Surgical History  Procedure Laterality Date  . Knee surgery    . Peripheral vascular catheterization Left 04/20/2015    Procedure: Thrombectomy;  Surgeon: Renford Dills, MD;  Location: [facility] INVASIVE CV LAB;  Service: Cardiovascular;  Laterality: Left;  . Peripheral vascular catheterization N/A 04/20/2015    Procedure: IVC Filter Insertion;  Surgeon: Renford Dills, MD;  Location: [facility] INVASIVE CV LAB;  Service: Cardiovascular;  Laterality: N/A;    Current Outpatient Rx  Name  Route  Sig  Dispense  Refill  . Rivaroxaban (XARELTO STARTER PACK) 15 & 20 MG TBPK      Take as directed on package: Start with one 15mg   tablet by mouth twice a day with food. On Day 22, switch to one 20mg  tablet once a day with food.   51 each   0     Allergies Review of patient's allergies indicates no known allergies.  No family history on file.  Social History History  Substance Use Topics  . Smoking status: Never Smoker   . Smokeless tobacco: Not on file  . Alcohol Use: Yes     Comment: causal    Review of Systems Constitutional: No fever/chills  Cardiovascular: Denies chest pain. Respiratory: Denies shortness of breath. Gastrointestinal: No abdominal pain.  No nausea, no vomiting.  No diarrhea.  No constipation. Genitourinary: Negative for dysuria. Musculoskeletal: Negative for back pain. Skin: Negative for rash. Neurological: Negative for headaches, focal weakness or numbness. No numbness in the toes or foot on the left foot.  10-point ROS otherwise negative.  ____________________________________________   PHYSICAL EXAM:  VITAL SIGNS: ED Triage Vitals  Enc Vitals Group     BP 05/15/15 2124 122/49 mmHg     Pulse Rate 05/15/15 2124 85     Resp 05/15/15 2124 18     Temp 05/15/15 2124 98.4 F (36.9 C)     Temp Source 05/15/15 2124 Oral     SpO2 05/15/15 2124 99 %     Weight 05/15/15 2124 239 lb (108.41 kg)     Height 05/15/15 2124 5\' 5"  (1.651 m)     Head Cir --  Peak Flow --      Pain Score 05/15/15 2125 9     Pain Loc --      Pain Edu? --      Excl. in GC? --     Constitutional: Alert and oriented. Well appearing and in no acute distress. Eyes: Conjunctivae are normal. PERRL. EOMI. Head: Atraumatic. Nose: No congestion/rhinnorhea. Mouth/Throat: Mucous membranes are moist.  Oropharynx non-erythematous. Neck: No stridor.   Cardiovascular: Normal rate, regular rhythm. Grossly normal heart sounds.  Good peripheral circulation. Respiratory: Normal respiratory effort.  No retractions. Lungs CTAB. Gastrointestinal: Soft and nontender. No distention. No abdominal bruits. No CVA  tenderness. Musculoskeletal: No lower extremity tenderness.  No joint effusions. Patient has 2+ lower extremity pitting edema in the left leg, only very minimal edema to the level of the ankle and the right. She has palpable dorsalis pedis pulses on both extremities. She moves all extremities well, wiggles all toes well, is able to lift and move at the hips and knees normally. Notably she does have moderate edema of the left lower leg. There is no erythema or redness. No focal swelling or lesions. Surgical site at the back of the left knee appears well-healed, surgical site in the right groin is not even visualizable as is evidently healed. Neurologic:  Normal speech and language. No gross focal neurologic deficits are appreciated. No gait instability. Skin:  Skin is warm, dry and intact. No rash noted. Psychiatric: Mood and affect are normal. Speech and behavior are normal.  ____________________________________________   LABS (all labs ordered are listed, but only abnormal results are displayed)  Labs Reviewed - No data to display ____________________________________________  EKG   ____________________________________________  RADIOLOGY   ____________________________________________   PROCEDURES  Procedure(s) performed: None  Critical Care performed: No  ____________________________________________   INITIAL IMPRESSION / ASSESSMENT AND PLAN / ED COURSE  Pertinent labs & imaging results that were available during my care of the patient were reviewed by me and considered in my medical decision making (see chart for details).  Patient presents with left leg edema which is been ongoing due to DVT likely. I discussed with vascular surgery Dr. Lorretta Harp and he advises that there is no further treatment options be on anticoagulation and having an IVC filter which is already in place area he advises that she needs to keep the leg elevated and minimize ambulation and standing. There is  no evidence of congestive heart failure or bilateral peripheral edema to suggest CHF. She has clear lungs and normal respirations.  Vascular surgery Dr. Evaristo Bury will be able to see the patient on Monday for follow-up. He advises no changes to her current medication regimen and no further workup in the ER. I think this is reasonable as we have a known DVT, and no other intervention is presently available per vascular surgery. Discussed with the patient return precautions and follow-up and she is agreeable and she will see vascular surgery Monday. ____________________________________________   FINAL CLINICAL IMPRESSION(S) / ED DIAGNOSES  Final diagnoses:  Deep venous thrombosis, left         Sharyn Creamer, MD 05/15/15 2233

## 2015-05-15 NOTE — ED Notes (Signed)
Pt presents with bilateral ankle and leg swelling since her surgery on June 28. Pt had blood clot in her leg and has a filter in her right leg. Pt's ankles and feet are peeling and swollen with minimal pitting. Pt also c/o pain and tingling in her hands. NAD noted.

## 2015-06-10 ENCOUNTER — Emergency Department
Admission: EM | Admit: 2015-06-10 | Discharge: 2015-06-10 | Disposition: A | Discharge status: Home / Self Care | Payer: BLUE CROSS/BLUE SHIELD | Attending: Emergency Medicine | Admitting: Emergency Medicine

## 2015-06-10 ENCOUNTER — Encounter: Payer: Self-pay | Admitting: Emergency Medicine

## 2015-06-10 DIAGNOSIS — Z79899 Other long term (current) drug therapy: Secondary | ICD-10-CM | POA: Insufficient documentation

## 2015-06-10 DIAGNOSIS — Z7901 Long term (current) use of anticoagulants: Secondary | ICD-10-CM | POA: Insufficient documentation

## 2015-06-10 DIAGNOSIS — N3000 Acute cystitis without hematuria: Secondary | ICD-10-CM

## 2015-06-10 LAB — URINALYSIS COMPLETE WITH MICROSCOPIC (ARMC ONLY)
Bilirubin Urine: NEGATIVE
GLUCOSE, UA: NEGATIVE mg/dL
Hgb urine dipstick: NEGATIVE
NITRITE: NEGATIVE
PROTEIN: 30 mg/dL — AB
SPECIFIC GRAVITY, URINE: 1.031 — AB (ref 1.005–1.030)
pH: 5 (ref 5.0–8.0)

## 2015-06-10 MED ORDER — FLUCONAZOLE 150 MG PO TABS: 150.0000 mg | ORAL_TABLET | Freq: Every day | ORAL | Status: AC

## 2015-06-10 MED ORDER — SULFAMETHOXAZOLE-TRIMETHOPRIM 800-160 MG PO TABS: 1.0000 | ORAL_TABLET | Freq: Two times a day (BID) | ORAL | Status: AC

## 2015-06-10 MED ORDER — PHENAZOPYRIDINE HCL 200 MG PO TABS: 200.0000 mg | ORAL_TABLET | Freq: Three times a day (TID) | ORAL | Status: AC | PRN

## 2015-06-10 NOTE — Discharge Instructions (Signed)

## 2015-06-10 NOTE — ED Provider Notes (Signed)
[facility] Emergency Department Provider Note  ____________________________________________  Time seen: Approximately 5:08 PM  I have reviewed the triage vital signs and the nursing notes.   HISTORY  Chief Complaint Back Pain and Urinary Frequency    HPI Alejandra Andrews is a 49 y.o. female who presents for evaluation of low back pain and urinary frequency and dysuria. He states symptoms started 2 days ago.   Past Medical History  Diagnosis Date  . DVT (deep venous thrombosis)     There are no active problems to display for this patient.   Past Surgical History  Procedure Laterality Date  . Knee surgery    . Peripheral vascular catheterization Left 04/20/2015    Procedure: Thrombectomy;  Surgeon: Renford Dills, MD;  Location: [facility] INVASIVE CV LAB;  Service: Cardiovascular;  Laterality: Left;  . Peripheral vascular catheterization N/A 04/20/2015    Procedure: IVC Filter Insertion;  Surgeon: Renford Dills, MD;  Location: [facility] INVASIVE CV LAB;  Service: Cardiovascular;  Laterality: N/A;    Current Outpatient Rx  Name  Route  Sig  Dispense  Refill  . fluconazole (DIFLUCAN) 150 MG tablet   Oral   Take 1 tablet (150 mg total) by mouth daily.   1 tablet   0   . phenazopyridine (PYRIDIUM) 200 MG tablet   Oral   Take 1 tablet (200 mg total) by mouth 3 (three) times daily as needed for pain.   6 tablet   0   . Rivaroxaban (XARELTO STARTER PACK) 15 & 20 MG TBPK      Take as directed on package: Start with one 15mg  tablet by mouth twice a day with food. On Day 22, switch to one 20mg  tablet once a day with food.   51 each   0   . sulfamethoxazole-trimethoprim (BACTRIM DS,SEPTRA DS) 800-160 MG per tablet   Oral   Take 1 tablet by mouth 2 (two) times daily.   20 tablet   0     Allergies Review of patient's allergies indicates no known allergies.  History reviewed. No pertinent family history.  Social History Social History  Substance  Use Topics  . Smoking status: Never Smoker   . Smokeless tobacco: None  . Alcohol Use: Yes     Comment: causal    Review of Systems Constitutional: No fever/chills Eyes: No visual changes. ENT: No sore throat. Cardiovascular: Denies chest pain. Respiratory: Denies shortness of breath. Gastrointestinal: No abdominal pain.  No nausea, no vomiting.  No diarrhea.  No constipation. Genitourinary: Positive for dysuria Musculoskeletal: Negative for back pain. Skin: Negative for rash. Neurological: Negative for headaches, focal weakness or numbness.  10-point ROS otherwise negative.  ____________________________________________   PHYSICAL EXAM:  VITAL SIGNS: ED Triage Vitals  Enc Vitals Group     BP 06/10/15 1652 120/63 mmHg     Pulse Rate 06/10/15 1652 109     Resp --      Temp 06/10/15 1652 97.9 F (36.6 C)     Temp Source 06/10/15 1652 Oral     SpO2 06/10/15 1652 98 %     Weight 06/10/15 1652 239 lb (108.41 kg)     Height 06/10/15 1652 5\' 6"  (1.676 m)     Head Cir --      Peak Flow --      Pain Score 06/10/15 1652 8     Pain Loc --      Pain Edu? --      Excl.  in GC? --     Constitutional: Alert and oriented. Well appearing and in no acute distress.  Cardiovascular: Normal rate, regular rhythm. Grossly normal heart sounds.  Good peripheral circulation. Respiratory: Normal respiratory effort.  No retractions. Lungs CTAB. Gastrointestinal: Soft and nontender. No distention. No abdominal bruits. Positive CVA tenderness with suprapubic tenderness. Musculoskeletal: No lower extremity tenderness nor edema.  No joint effusions. Neurologic:  Normal speech and language. No gross focal neurologic deficits are appreciated. No gait instability. Skin:  Skin is warm, dry and intact. No rash noted. Psychiatric: Mood and affect are normal. Speech and behavior are normal.  ____________________________________________   LABS (all labs ordered are listed, but only abnormal results  are displayed)  Labs Reviewed  URINALYSIS COMPLETEWITH MICROSCOPIC ([facility] ONLY) - Abnormal; Notable for the following:    Color, Urine AMBER (*)    APPearance HAZY (*)    Ketones, ur TRACE (*)    Specific Gravity, Urine 1.031 (*)    Protein, ur 30 (*)    Leukocytes, UA TRACE (*)    Bacteria, UA RARE (*)    Squamous Epithelial / LPF 0-5 (*)    All other components within normal limits   ____________________________________________    PROCEDURES  Procedure(s) performed: None  Critical Care performed: No  ____________________________________________   INITIAL IMPRESSION / ASSESSMENT AND PLAN / ED COURSE  Pertinent labs & imaging results that were available during my care of the patient were reviewed by me and considered in my medical decision making (see chart for details).  Acute UTI. Rx given for Bactrim DS twice a day Diflucan 150 mg times one and Pyridium 200 mg 3 times a day. Patient voices no other emergency medical complaints at this time and will return to the ER with any worsening symptomology. ____________________________________________   FINAL CLINICAL IMPRESSION(S) / ED DIAGNOSES  Final diagnoses:  Acute cystitis without hematuria      Evangeline Dakin, PA-C 06/10/15 1739  Phineas Semen, MD 06/10/15 [phone]

## 2015-06-10 NOTE — ED Notes (Signed)
Patient presents with lower back pain, reports some urinary symptoms as well.

## 2015-06-15 ENCOUNTER — Ambulatory Visit
Admission: RE | Admit: 2015-06-15 | Payer: BLUE CROSS/BLUE SHIELD | Source: Ambulatory Visit | Admitting: Vascular Surgery

## 2015-06-15 ENCOUNTER — Encounter: Admission: RE | Payer: Self-pay | Source: Ambulatory Visit

## 2015-06-15 SURGERY — IVC FILTER REMOVAL
Anesthesia: Moderate Sedation

## 2015-07-25 IMAGING — US US EXTREM LOW VENOUS*L*
1 series · 13 of 24 positions shown · non-contrast
Comparison: None.

CLINICAL DATA: Left leg pain for 1 week.  Edema.



[Series 1: us extrem low venous*left* · 0.08mm/px · 13 of 38 slices shown]
[im 1/38]
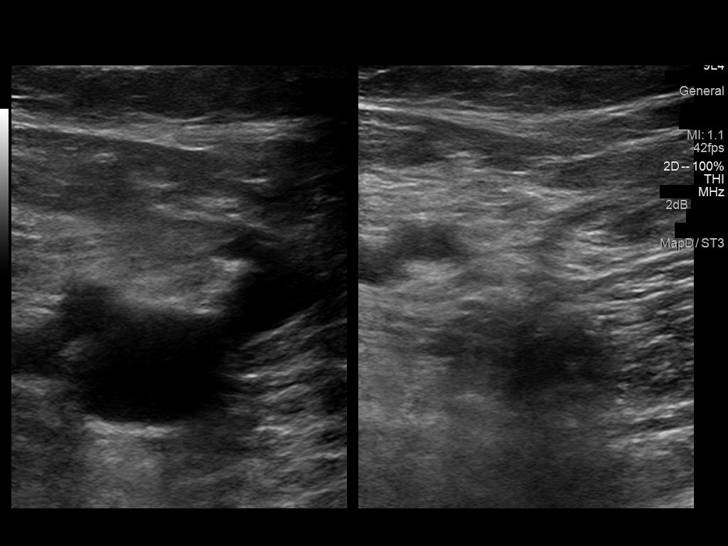
[im 4/38]
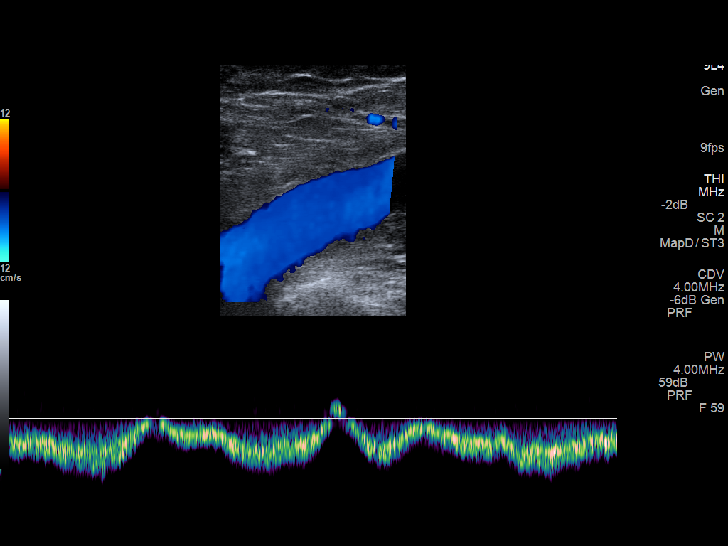
[im 7/38]
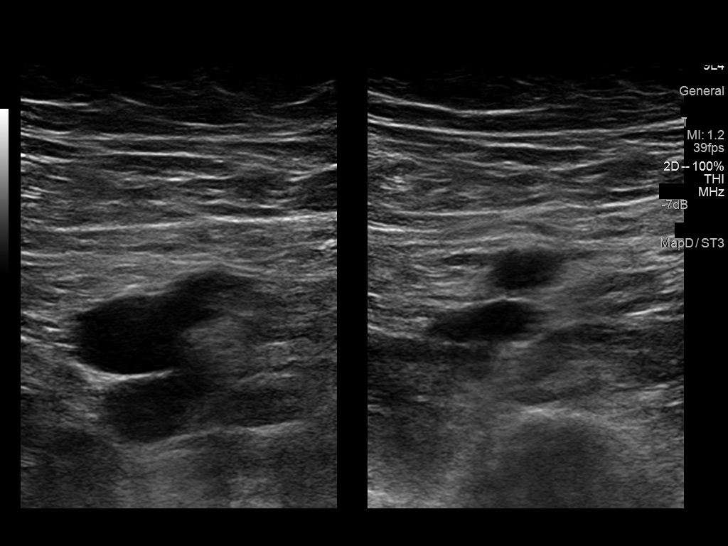
[im 10/38]
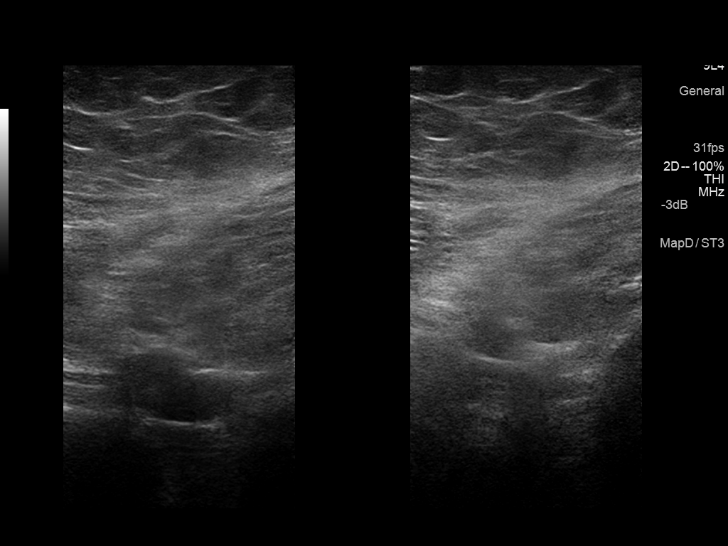
[im 13/38]
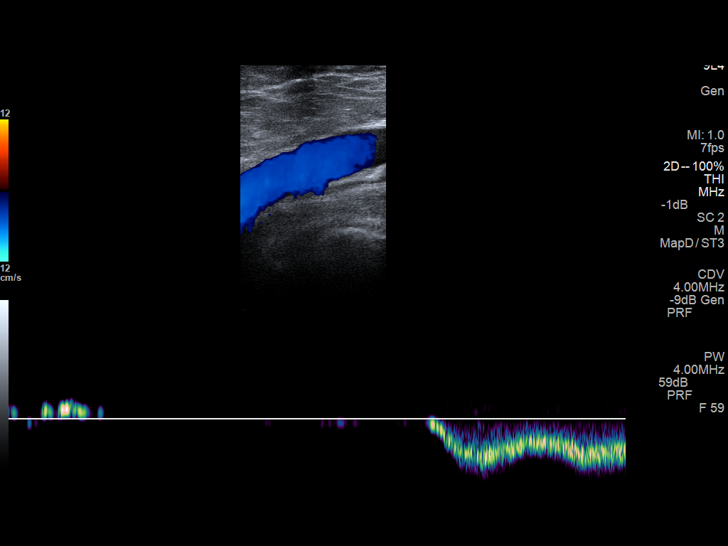
[im 17/38]
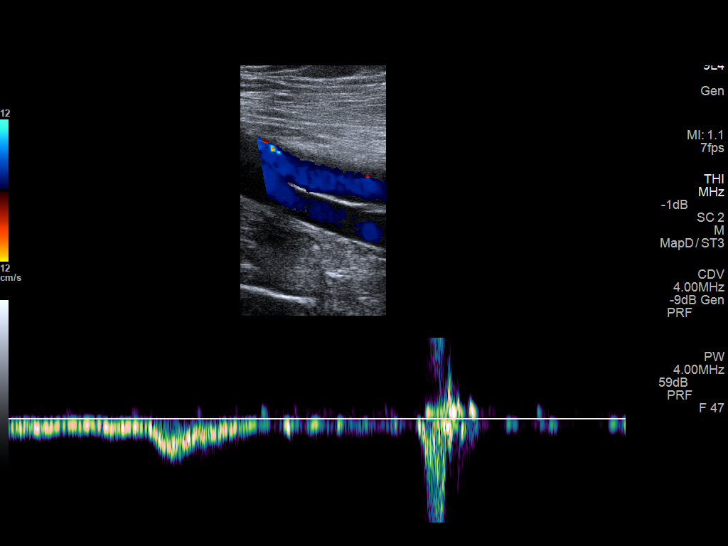
[im 20/38]
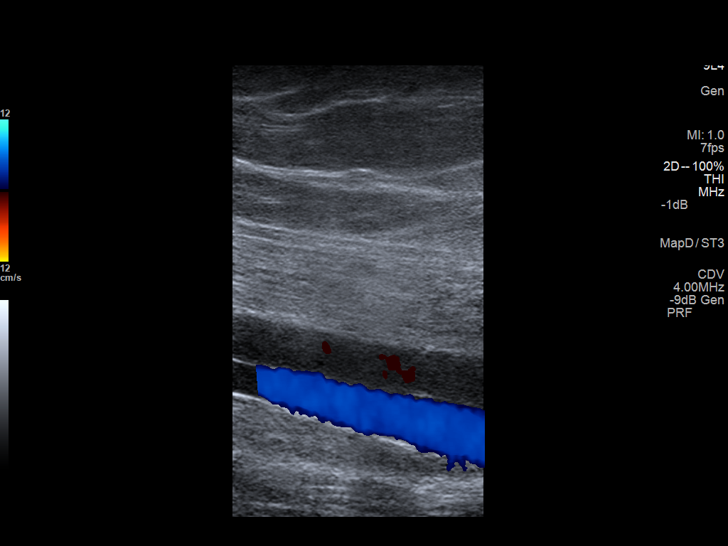
[im 21/38]
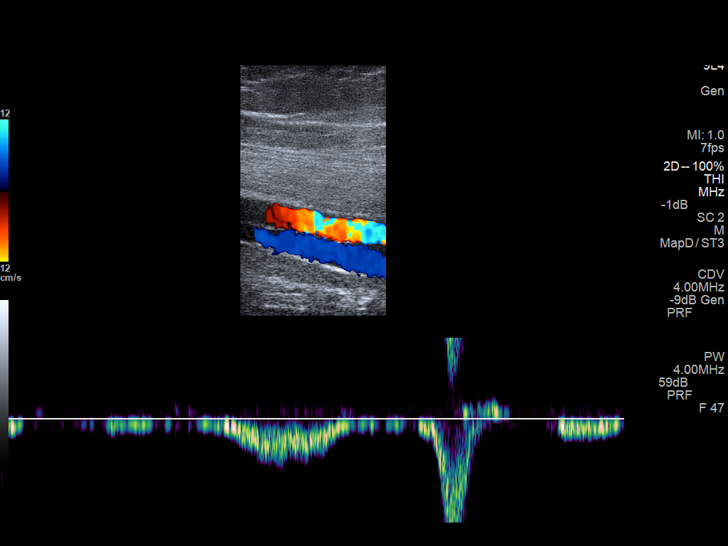
[im 25/38]
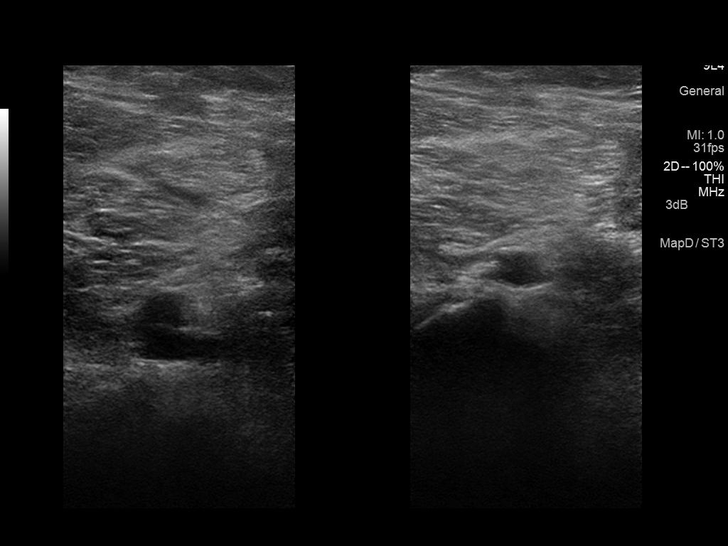
[im 28/38]
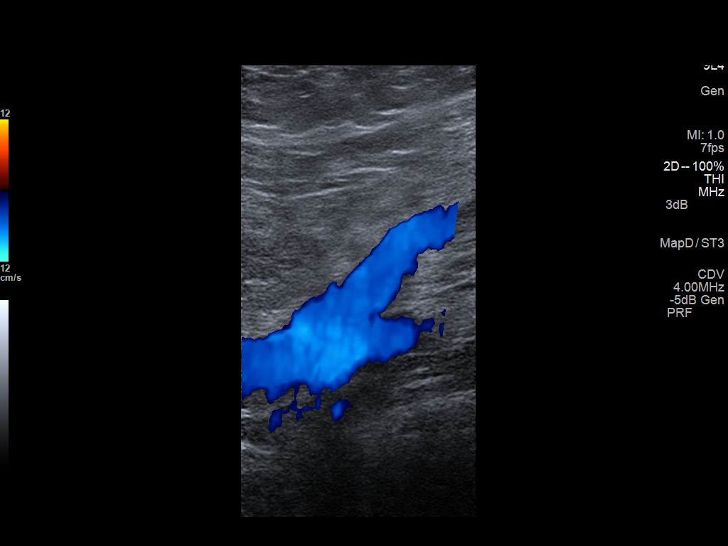
[im 31/38]
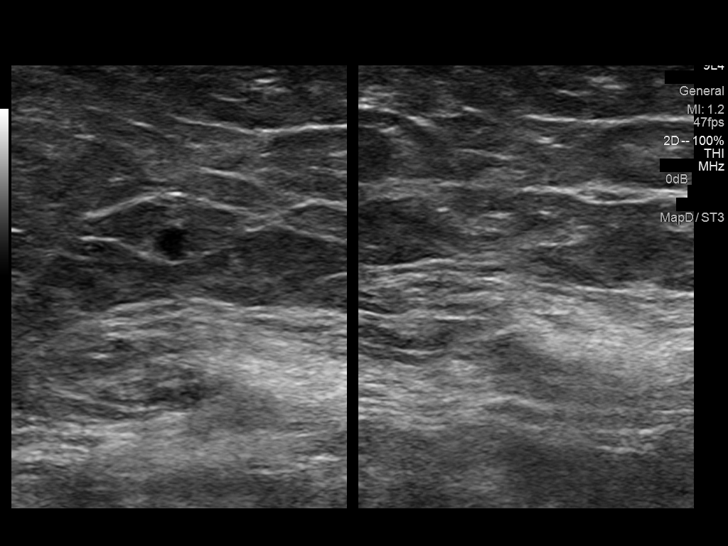
[im 34/38]
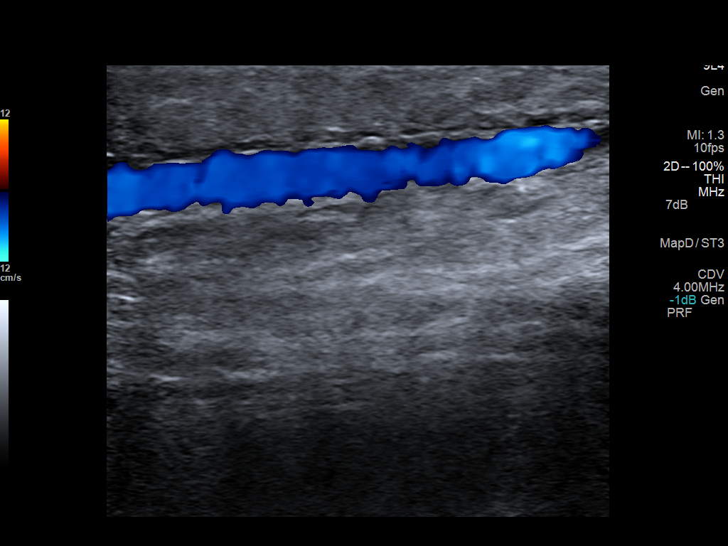
[im 38/38]
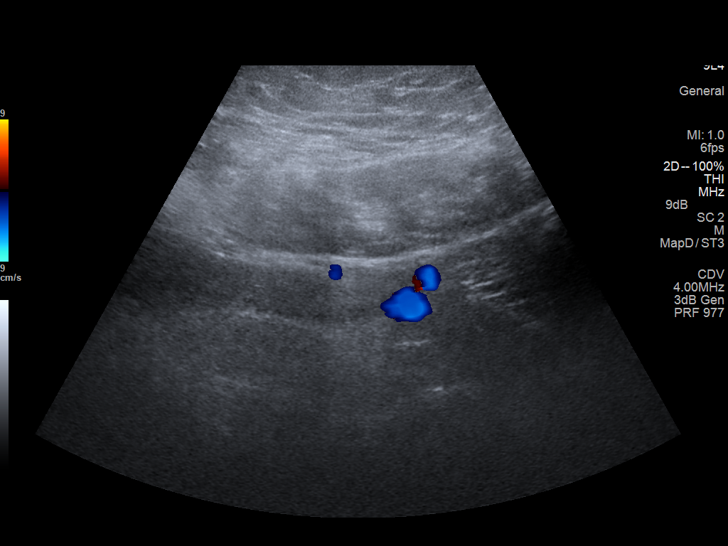

[13 of 24 positions shown; findings below may reference images not displayed]

FINDINGS: Contralateral Common Femoral Vein: Respiratory phasicity is normal
and symmetric with the symptomatic side. No evidence of thrombus.
Normal compressibility.

Common Femoral Vein: No evidence of thrombus. Normal
compressibility, respiratory phasicity and response to augmentation.

Saphenofemoral Junction: No evidence of thrombus. Normal
compressibility and flow on color Doppler imaging.

Profunda Femoral Vein: No evidence of thrombus. Normal
compressibility and flow on color Doppler imaging.

Femoral Vein: No evidence of thrombus. Normal compressibility,
respiratory phasicity and response to augmentation.

Popliteal Vein: No evidence of thrombus. Normal compressibility,
respiratory phasicity and response to augmentation.

Calf Veins: Peroneal vein not visualized. Posterior tibial vein
demonstrates No evidence of thrombus. Normal compressibility and
flow on color Doppler imaging.

Superficial Great Saphenous Vein: No evidence of thrombus. Normal
compressibility and flow on color Doppler imaging.

Venous Reflux:  None.

Other Findings:  None.
IMPRESSION: No evidence of deep venous thrombosis.

## 2015-09-23 DEATH — deceased

## 2017-05-21 IMAGING — US US EXTREM LOW VENOUS*L*
1 series · 13 of 24 positions shown · non-contrast
Comparison: None.

CLINICAL DATA: Subacute onset of left leg swelling and pain for 2
weeks. Initial encounter.



[Series 1: us extrem low venous*left* · 0.09mm/px · 13 of 29 slices shown]
[im 1/29]
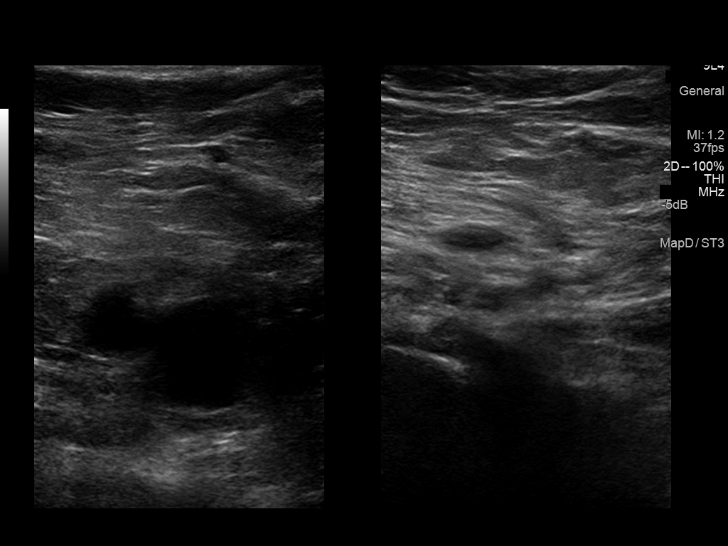
[im 3/29]
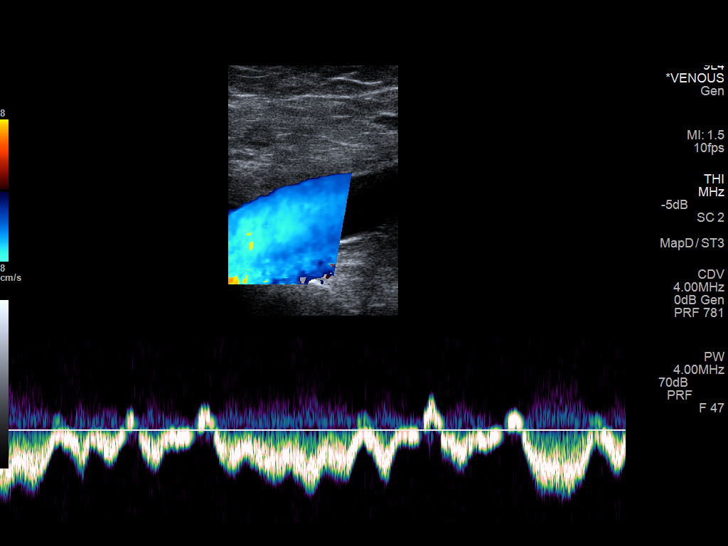
[im 5/29]
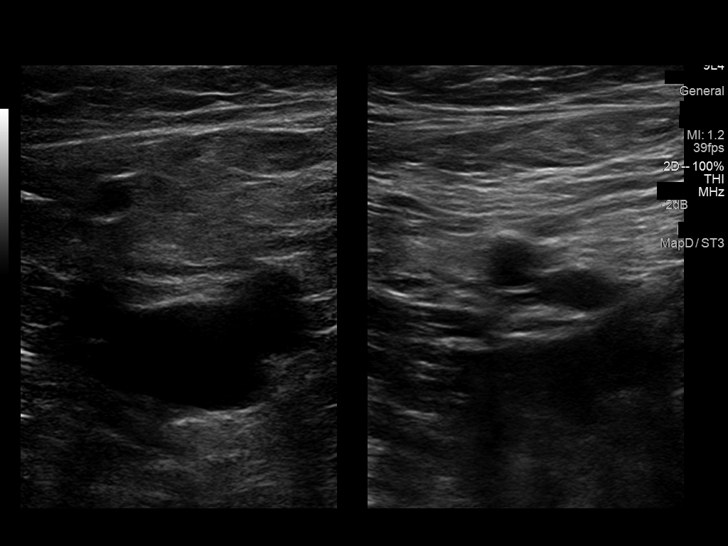
[im 8/29]
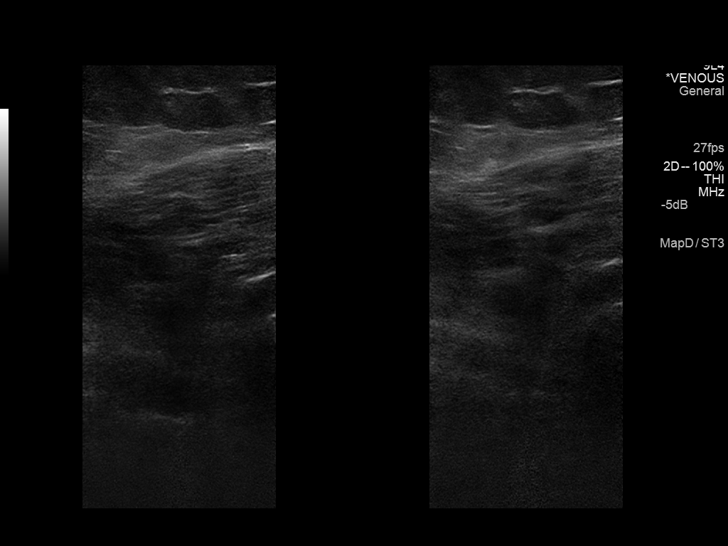
[im 10/29]
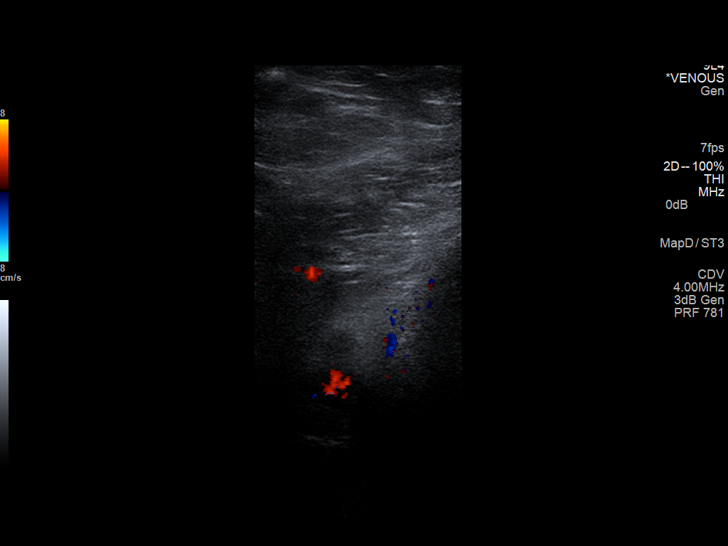
[im 13/29]
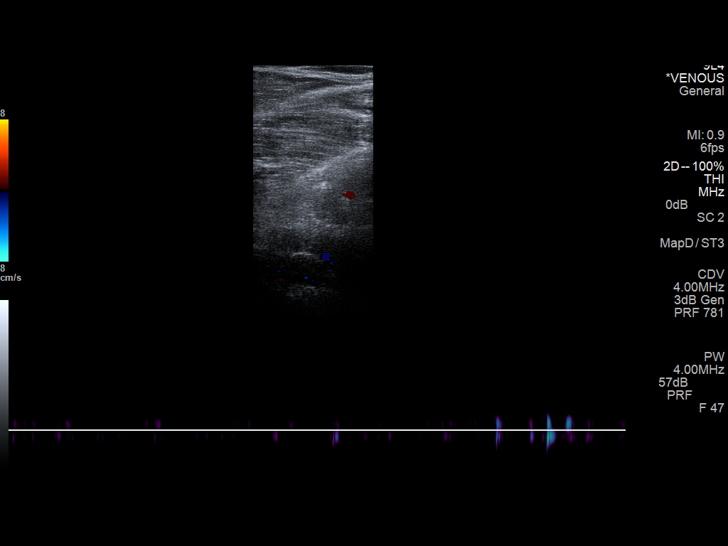
[im 15/29]
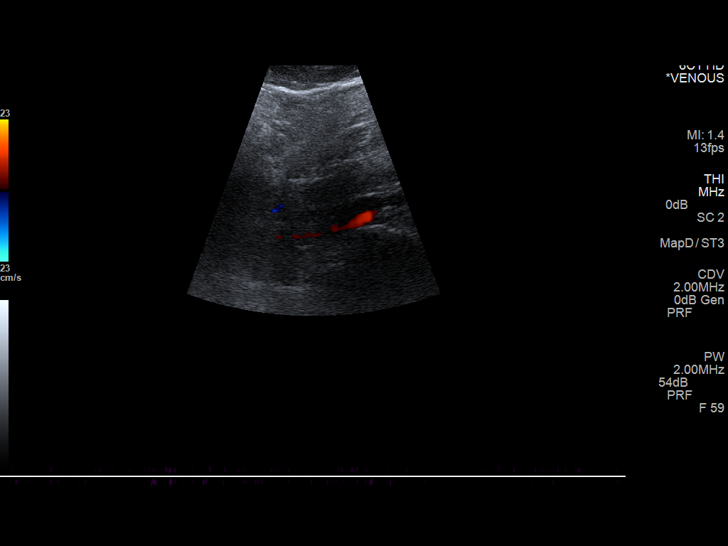
[im 16/29]
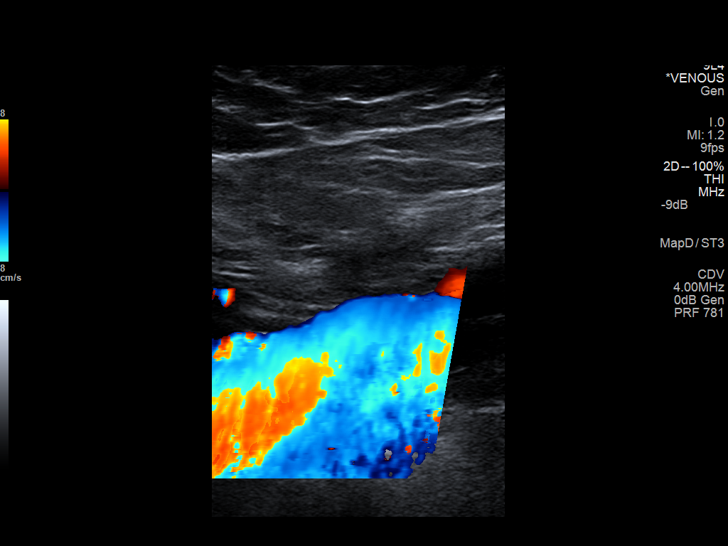
[im 19/29]
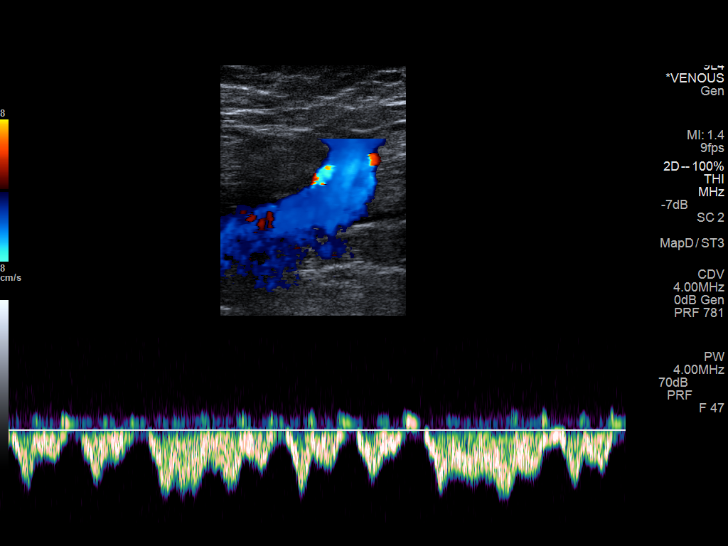
[im 21/29]
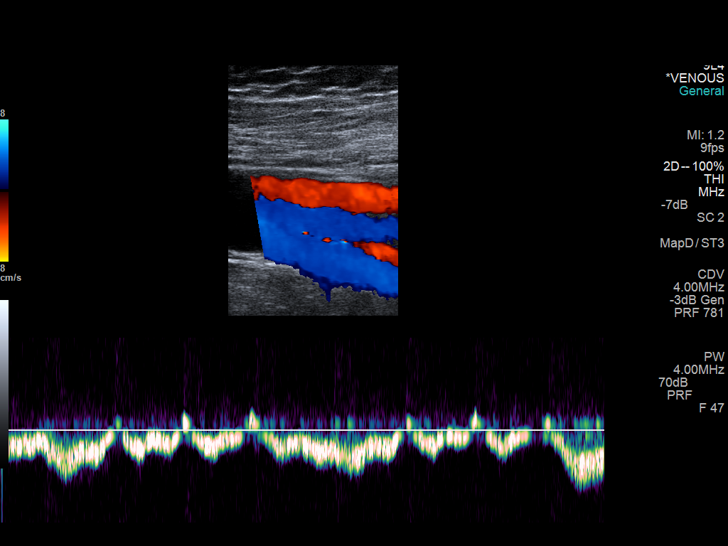
[im 24/29]
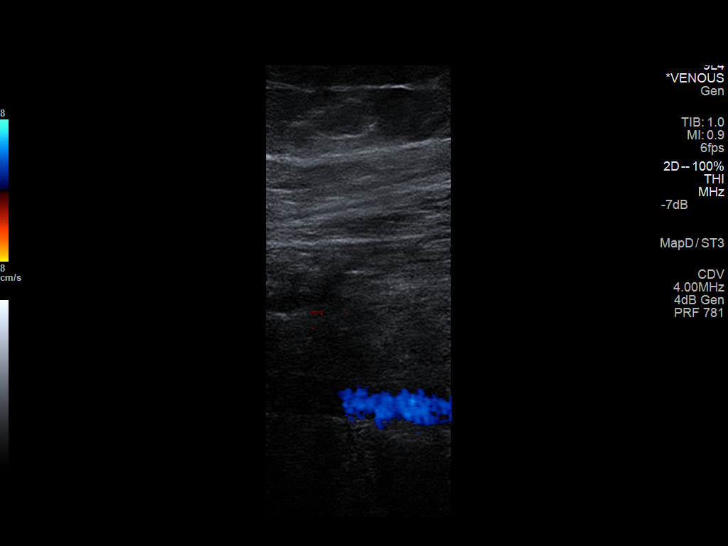
[im 26/29]
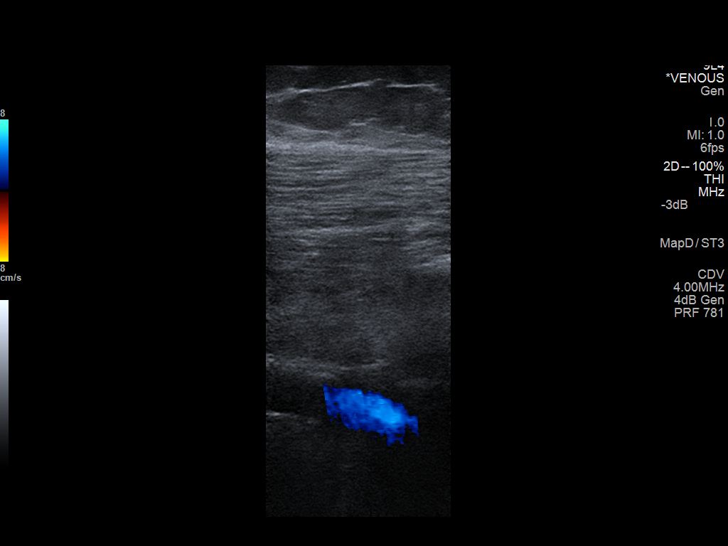
[im 29/29]
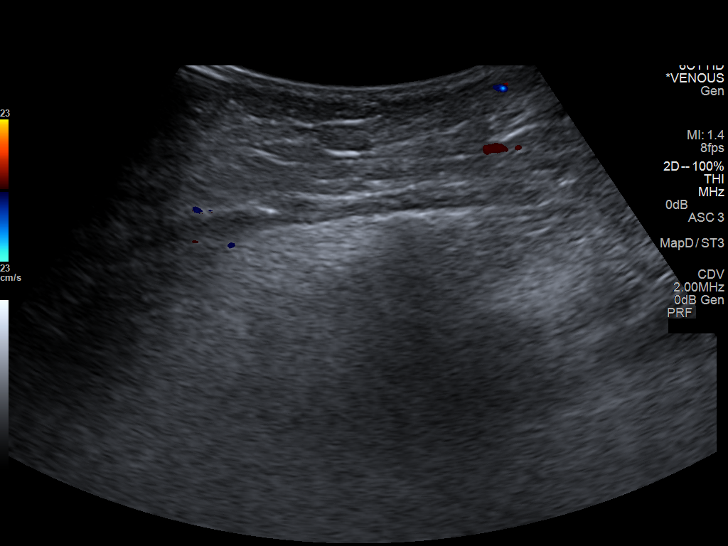

[13 of 24 positions shown; findings below may reference images not displayed]

FINDINGS: Contralateral Common Femoral Vein: Respiratory phasicity is normal
and symmetric with the symptomatic side. No evidence of thrombus.
Normal compressibility.

Common Femoral Vein: No evidence of thrombus. Normal
compressibility, respiratory phasicity and response to augmentation.

Saphenofemoral Junction: No evidence of thrombus. Normal
compressibility and flow on color Doppler imaging.

Profunda Femoral Vein: No evidence of thrombus. Normal
compressibility and flow on color Doppler imaging.

Femoral Vein: No evidence of thrombus. Normal compressibility,
respiratory phasicity and response to augmentation.

Popliteal Vein: Occlusive thrombus is noted within the popliteal
vein, extending along its entire course.

Calf Veins: Not visualized on this study due to the patient's
habitus.

Superficial Great Saphenous Vein: No evidence of thrombus. Normal
compressibility and flow on color Doppler imaging.

Venous Reflux:  None.

Other Findings:  None.
IMPRESSION: Occlusive thrombus noted filling the left popliteal vein. The calf
veins are not visualized on this study.

These results were called by telephone at the time of interpretation
on 04/07/2015 at [DATE] to Dr. MARC APOLINAR, who verbally
acknowledged these results.
